# Patient Record
Sex: Female | Born: 1955 | Race: Black or African American | Hispanic: No | Marital: Married | Smoking: Never smoker
Health system: Southern US, Community
[De-identification: ages and names within clinical notes are randomized; demographics above are authoritative.]

## PROBLEM LIST (undated history)

## (undated) DIAGNOSIS — I10 Essential (primary) hypertension: Secondary | ICD-10-CM

## (undated) HISTORY — PX: FOOT SURGERY: SHX648

---

## 2007-08-24 ENCOUNTER — Encounter: Admission: RE | Admit: 2007-08-24 | Discharge: 2007-08-24 | Payer: Self-pay | Admitting: Obstetrics and Gynecology

## 2007-08-28 ENCOUNTER — Encounter: Admission: RE | Admit: 2007-08-28 | Discharge: 2007-08-28 | Payer: Self-pay | Admitting: Interventional Radiology

## 2010-02-20 ENCOUNTER — Encounter: Admission: RE | Admit: 2010-02-20 | Discharge: 2010-02-20 | Payer: Self-pay | Admitting: Obstetrics and Gynecology

## 2010-08-23 ENCOUNTER — Other Ambulatory Visit: Payer: Self-pay | Admitting: Obstetrics and Gynecology

## 2010-08-23 DIAGNOSIS — Z1231 Encounter for screening mammogram for malignant neoplasm of breast: Secondary | ICD-10-CM

## 2010-09-23 ENCOUNTER — Ambulatory Visit
Admission: RE | Admit: 2010-09-23 | Discharge: 2010-09-23 | Disposition: A | Discharge status: Home / Self Care | Payer: BC Managed Care – PPO | Source: Ambulatory Visit | Attending: Obstetrics and Gynecology | Admitting: Obstetrics and Gynecology

## 2010-09-23 DIAGNOSIS — Z1231 Encounter for screening mammogram for malignant neoplasm of breast: Secondary | ICD-10-CM

## 2011-09-03 ENCOUNTER — Other Ambulatory Visit: Payer: Self-pay | Admitting: Obstetrics and Gynecology

## 2011-09-03 DIAGNOSIS — Z1231 Encounter for screening mammogram for malignant neoplasm of breast: Secondary | ICD-10-CM

## 2011-09-24 ENCOUNTER — Ambulatory Visit
Admission: RE | Admit: 2011-09-24 | Discharge: 2011-09-24 | Disposition: A | Discharge status: Home / Self Care | Payer: BC Managed Care – PPO | Source: Ambulatory Visit | Attending: Obstetrics and Gynecology | Admitting: Obstetrics and Gynecology

## 2011-09-24 DIAGNOSIS — Z1231 Encounter for screening mammogram for malignant neoplasm of breast: Secondary | ICD-10-CM

## 2011-10-09 IMAGING — MG MM DIGITAL DIAGNOSTIC UNILAT L
4 series · 4 of 4 positions shown · non-contrast
Comparison: 09/24/2009, 09/20/2009, 09/19/2008, and 09/16/2007
mammograms from [HOSPITAL], [facility].

CLINICAL DATA: The patient was referred for reevaluation of
possible area of asymmetry within the left breast.  The patient has
a family history breast cancer in a maternal aunt (post
menopausal).  There is no other family history breast cancer.

DIGITAL DIAGNOSTIC LEFT BREAST MAMMOGRAM  AND LEFT BREAST
ULTRASOUND:

[L CC (1 of 2)]
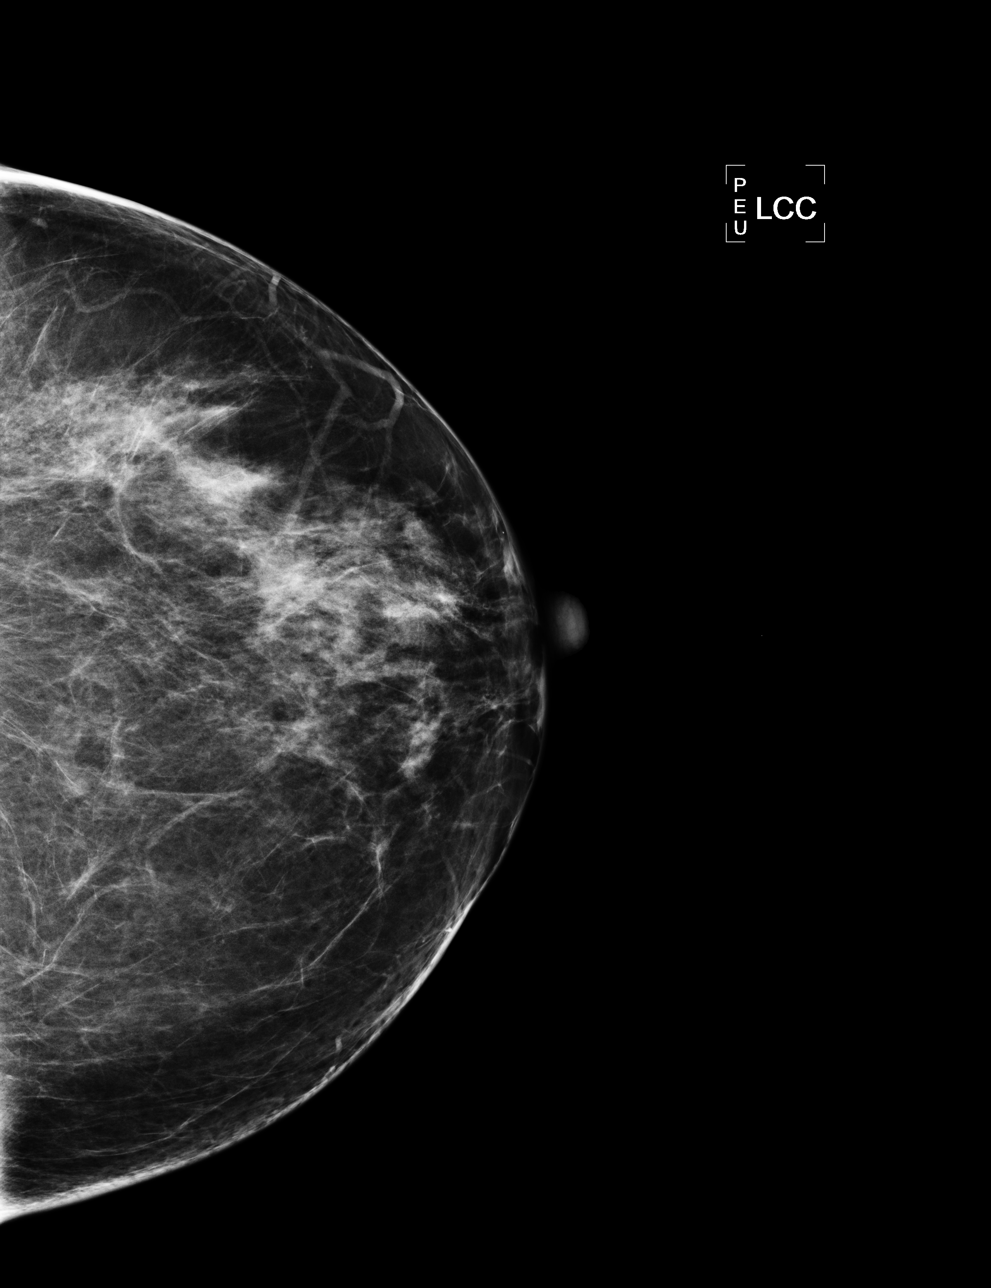

[L MLO (1 of 2)]
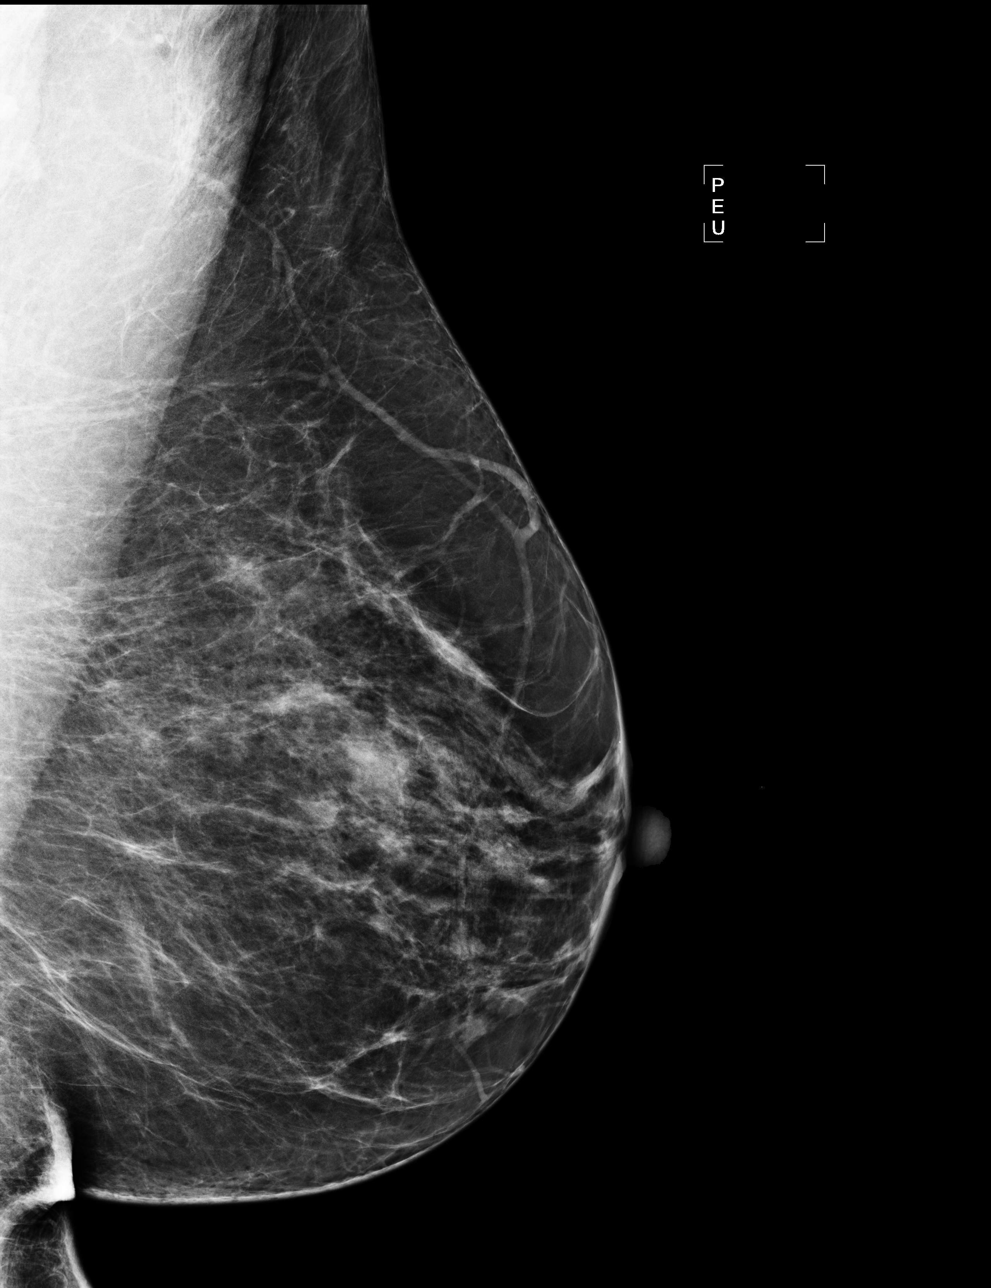

[L CC (2 of 2)]
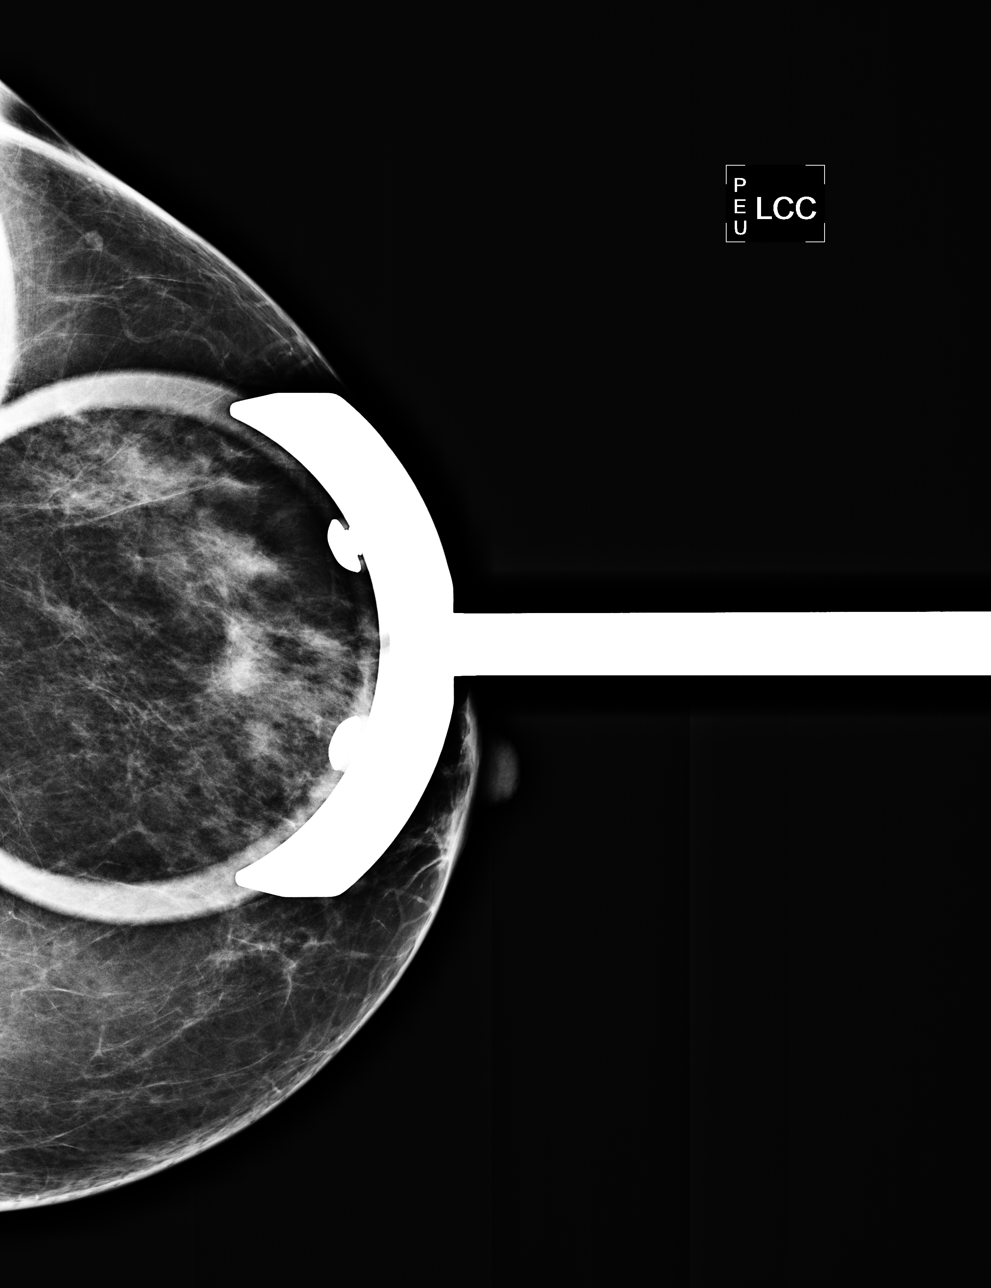

[L MLO (2 of 2)]
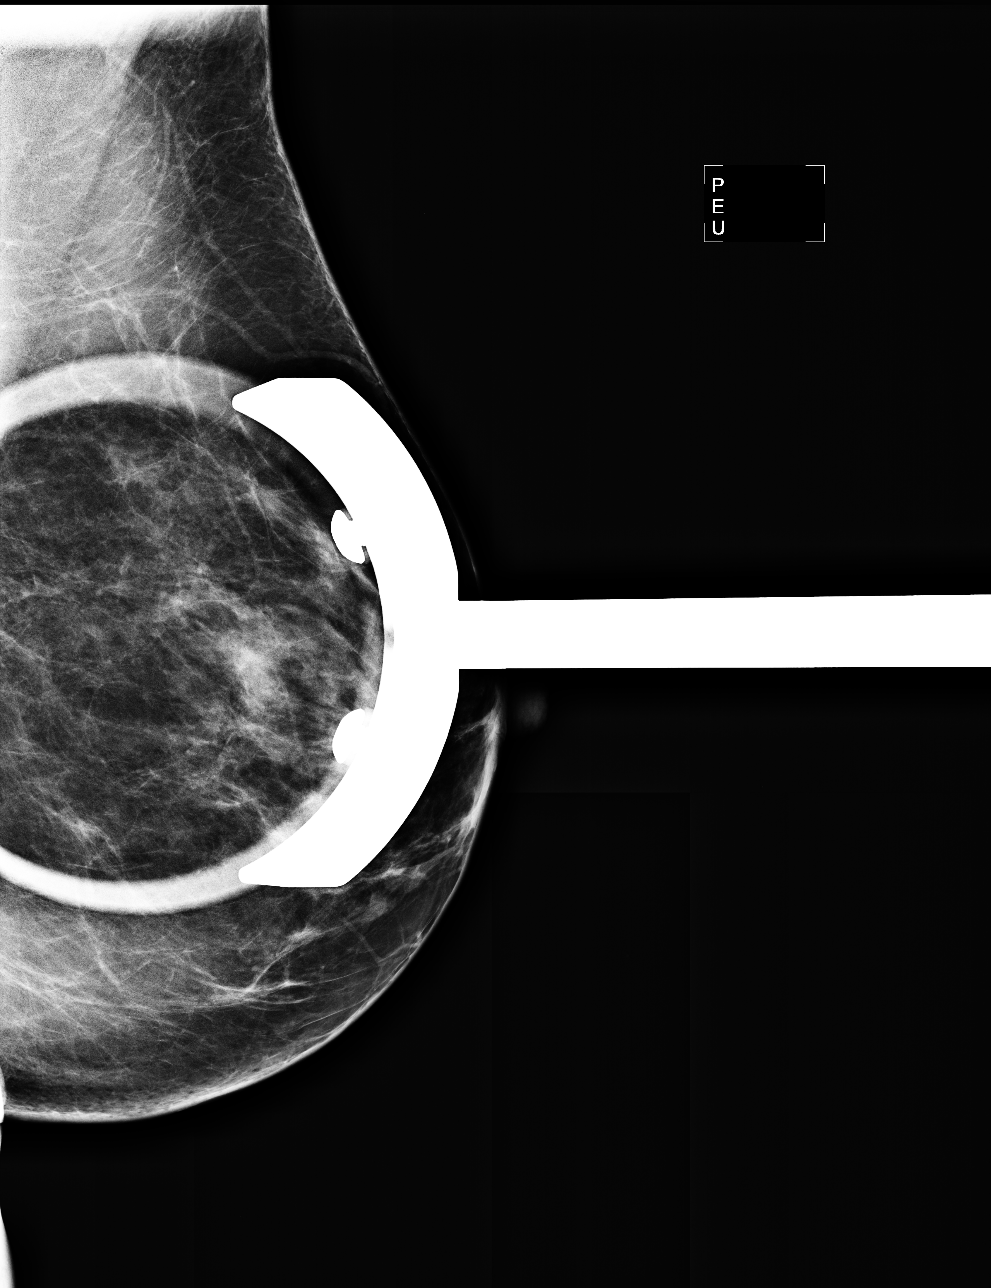

[4 of 4 positions shown; findings below may reference images not displayed]

FINDINGS: There is a scattered fibroglandular parenchymal pattern
present.  When comparing our current study with studies dating back
to 09/16/2007, the area of asymmetry located within the lateral and
subareolar portions of the left breast appears stable and most
consistent with asymmetrical fibroglandular tissue.  There is no
worrisome calcification or distortion.

On physical exam, there is no discrete palpable abnormality within
the subareolar portion of the left breast or upper-outer quadrant
of the left breast.

Ultrasound is performed, showing normal-appearing fibroglandular
tissue within the subareolar portion left breast and also within
the superior and upper-outer portions of the left breast

There is no mass, cyst, distortion, or worrisome shadowing.
IMPRESSION: The area of asymmetry within the left breast is most consistent
with normal asymmetrical fibroglandular tissue.  There are no
findings worrisome for malignancy.  Recommend return to screening
mammography in August 2010. The importance of breast self-
examination was reviewed with the patient.

BI-RADS CATEGORY 1:  Negative.

## 2011-12-01 ENCOUNTER — Other Ambulatory Visit: Payer: Self-pay | Admitting: Obstetrics and Gynecology

## 2011-12-01 DIAGNOSIS — E2839 Other primary ovarian failure: Secondary | ICD-10-CM

## 2011-12-01 DIAGNOSIS — N951 Menopausal and female climacteric states: Secondary | ICD-10-CM

## 2011-12-11 ENCOUNTER — Other Ambulatory Visit: Payer: BC Managed Care – PPO

## 2012-09-29 ENCOUNTER — Other Ambulatory Visit: Payer: Self-pay

## 2012-09-29 DIAGNOSIS — Z1231 Encounter for screening mammogram for malignant neoplasm of breast: Secondary | ICD-10-CM

## 2012-11-04 ENCOUNTER — Ambulatory Visit
Admission: RE | Admit: 2012-11-04 | Discharge: 2012-11-04 | Disposition: A | Discharge status: Home / Self Care | Payer: Managed Care, Other (non HMO) | Source: Ambulatory Visit | Attending: Obstetrics and Gynecology | Admitting: Obstetrics and Gynecology

## 2012-11-04 ENCOUNTER — Ambulatory Visit
Admission: RE | Admit: 2012-11-04 | Discharge: 2012-11-04 | Disposition: A | Discharge status: Home / Self Care | Payer: Managed Care, Other (non HMO) | Source: Ambulatory Visit

## 2012-11-04 DIAGNOSIS — E2839 Other primary ovarian failure: Secondary | ICD-10-CM

## 2012-11-04 DIAGNOSIS — Z1231 Encounter for screening mammogram for malignant neoplasm of breast: Secondary | ICD-10-CM

## 2012-11-04 DIAGNOSIS — N951 Menopausal and female climacteric states: Secondary | ICD-10-CM

## 2013-05-14 ENCOUNTER — Emergency Department (HOSPITAL_COMMUNITY): Payer: Managed Care, Other (non HMO)

## 2013-05-14 ENCOUNTER — Encounter (HOSPITAL_COMMUNITY): Payer: Self-pay | Admitting: Emergency Medicine

## 2013-05-14 ENCOUNTER — Emergency Department (HOSPITAL_COMMUNITY)
Admission: EM | Admit: 2013-05-14 | Discharge: 2013-05-14 | Disposition: A | Discharge status: Home / Self Care | Payer: Managed Care, Other (non HMO) | Attending: Emergency Medicine | Admitting: Emergency Medicine

## 2013-05-14 DIAGNOSIS — R1031 Right lower quadrant pain: Secondary | ICD-10-CM | POA: Insufficient documentation

## 2013-05-14 DIAGNOSIS — G8929 Other chronic pain: Secondary | ICD-10-CM | POA: Insufficient documentation

## 2013-05-14 DIAGNOSIS — R1032 Left lower quadrant pain: Secondary | ICD-10-CM | POA: Insufficient documentation

## 2013-05-14 DIAGNOSIS — K5900 Constipation, unspecified: Secondary | ICD-10-CM | POA: Insufficient documentation

## 2013-05-14 DIAGNOSIS — R109 Unspecified abdominal pain: Secondary | ICD-10-CM

## 2013-05-14 HISTORY — DX: Essential (primary) hypertension: I10

## 2013-05-14 LAB — COMPREHENSIVE METABOLIC PANEL
Albumin: 4.1 g/dL (ref 3.5–5.2)
BUN: 9 mg/dL (ref 6–23)
Calcium: 9.5 mg/dL (ref 8.4–10.5)
Chloride: 103 mEq/L (ref 96–112)
Creatinine, Ser: 0.77 mg/dL (ref 0.50–1.10)
Total Bilirubin: 0.3 mg/dL (ref 0.3–1.2)
Total Protein: 7.5 g/dL (ref 6.0–8.3)

## 2013-05-14 LAB — CBC WITH DIFFERENTIAL/PLATELET
Basophils Absolute: 0 10*3/uL (ref 0.0–0.1)
Basophils Relative: 0 % (ref 0–1)
Eosinophils Absolute: 0.1 10*3/uL (ref 0.0–0.7)
Eosinophils Relative: 2 % (ref 0–5)
HCT: 40.4 % (ref 36.0–46.0)
Hemoglobin: 13.7 g/dL (ref 12.0–15.0)
MCH: 31.5 pg (ref 26.0–34.0)
MCHC: 33.9 g/dL (ref 30.0–36.0)
MCV: 92.9 fL (ref 78.0–100.0)
Monocytes Absolute: 0.3 10*3/uL (ref 0.1–1.0)
Monocytes Relative: 7 % (ref 3–12)
RDW: 15.1 % (ref 11.5–15.5)

## 2013-05-14 LAB — LIPASE, BLOOD: Lipase: 32 U/L (ref 11–59)

## 2013-05-14 LAB — URINALYSIS, ROUTINE W REFLEX MICROSCOPIC
Bilirubin Urine: NEGATIVE
Glucose, UA: NEGATIVE mg/dL
Ketones, ur: NEGATIVE mg/dL
Protein, ur: NEGATIVE mg/dL
pH: 7 (ref 5.0–8.0)

## 2013-05-14 MED ORDER — HYDROCODONE-ACETAMINOPHEN 5-325 MG PO TABS: ORAL_TABLET | ORAL | Status: DC

## 2013-05-14 MED ORDER — IOHEXOL 300 MG/ML  SOLN
50.0000 mL | Freq: Once | INTRAMUSCULAR | Status: AC | PRN
  Administered 2013-05-14: 50 mL via ORAL

## 2013-05-14 MED ORDER — SODIUM CHLORIDE 0.9 % IV BOLUS (SEPSIS)
500.0000 mL | Freq: Once | INTRAVENOUS | Status: AC
  Administered 2013-05-14: 500 mL via INTRAVENOUS

## 2013-05-14 MED ORDER — ONDANSETRON HCL 4 MG/2ML IJ SOLN: 4.0000 mg | Freq: Once | INTRAMUSCULAR | Status: DC

## 2013-05-14 MED ORDER — MORPHINE SULFATE 4 MG/ML IJ SOLN: 4.0000 mg | Freq: Once | INTRAMUSCULAR | Status: DC

## 2013-05-14 NOTE — ED Notes (Signed)
Pt denies nausea and did not want pain /nausea meds at this time due to low pain rate. Nad.

## 2013-05-14 NOTE — ED Notes (Signed)
Pt decided not to do ct scan and advised pa. Advised pt not to drink anymore contrast.

## 2013-05-14 NOTE — ED Provider Notes (Signed)
CSN: [id number]     Arrival date & time 05/14/13  0865 History   First MD Initiated Contact with Patient 05/14/13 [phone]     Chief Complaint  Patient presents with  . Abdominal Pain   (Consider location/radiation/quality/duration/timing/severity/associated sxs/prior Treatment) Patient is a 57 y.o. female presenting with abdominal pain. The history is provided by the patient.  Abdominal Pain Pain location:  RLQ Pain quality: sharp   Pain radiates to:  LLQ Pain severity:  Mild Onset quality:  Gradual Duration: 3 months. Timing:  Intermittent Progression:  Waxing and waning Chronicity:  Chronic Context comment:  Pain to her right lower stomach became worse last evening after eating  fried seafood Relieved by:  Lying down Exacerbated by: Certain foods and sitting. Ineffective treatments:  None tried Associated symptoms: constipation   Associated symptoms: no belching, no chest pain, no chills, no cough, no diarrhea, no dysuria, no fever, no flatus, no hematemesis, no hematochezia, no hematuria, no melena, no nausea, no shortness of breath, no vaginal bleeding, no vaginal discharge and no vomiting    Patient reports waxing and waning of her symptoms for 3 months. Pain has been worse since last evening. She states that she comes to the emergency department this morning because her primary care physician in [place] has evaluated her for the same as well as the emergency department at [facility], a urologist and her gynecologist and her tests have been inconclusive. She also states that her primary care physician is supposed to refer her to a gastroenterologist but she has been waiting for the appointment for over one month and has not heard back from anyone. She denies any vomiting diarrhea or bloody stools. She also denies any fever , dysuria vaginal bleeding, chest or back pain.    No past medical history on file. No past surgical history on file. No family history on file. History    Substance Use Topics  . Smoking status: Not on file  . Smokeless tobacco: Not on file  . Alcohol Use: Not on file   OB History   Grav Para Term Preterm Abortions TAB SAB Ect Mult Living                 Review of Systems  Constitutional: Negative for fever, chills, activity change and appetite change.  Respiratory: Negative for cough and shortness of breath.   Cardiovascular: Negative for chest pain.  Gastrointestinal: Positive for abdominal pain and constipation. Negative for nausea, vomiting, diarrhea, blood in stool, melena, hematochezia, abdominal distention, flatus and hematemesis.  Genitourinary: Negative for dysuria, hematuria, flank pain, decreased urine volume, vaginal bleeding, vaginal discharge, difficulty urinating and vaginal pain.  Musculoskeletal: Negative for back pain.  Skin: Negative for color change and rash.  Neurological: Negative for dizziness, syncope, weakness and numbness.  Hematological: Negative for adenopathy.  All other systems reviewed and are negative.    Allergies  Review of patient's allergies indicates no known allergies.  Home Medications  No current outpatient prescriptions on file. There were no vitals taken for this visit. Physical Exam  Nursing note and vitals reviewed. Constitutional: She is oriented to person, place, and time. She appears well-developed and well-nourished. No distress.  HENT:  Head: Normocephalic and atraumatic.  Mouth/Throat: Oropharynx is clear and moist.  Cardiovascular: Normal rate, regular rhythm, normal heart sounds and intact distal pulses.   No murmur heard. Pulmonary/Chest: Effort normal and breath sounds normal. No respiratory distress.  Abdominal: Soft. Normal appearance and bowel sounds are normal. She exhibits  no distension and no mass. There is no hepatosplenomegaly. There is tenderness in the right lower quadrant. There is no rigidity, no rebound, no guarding, no CVA tenderness and no tenderness at  McBurney's point.    Mild ttp of the RLQ, no guarding or rebound tenderness.  No CVA tenderness  Musculoskeletal: Normal range of motion. She exhibits no edema.  Neurological: She is alert and oriented to person, place, and time. She exhibits normal muscle tone. Coordination normal.  Skin: Skin is warm and dry.    ED Course  Procedures (including critical care time) Labs Review Labs Reviewed  URINALYSIS, ROUTINE W REFLEX MICROSCOPIC - Abnormal; Notable for the following:    Specific Gravity, Urine <1.005 (*)    All other components within normal limits  CBC WITH DIFFERENTIAL  COMPREHENSIVE METABOLIC PANEL  LIPASE, BLOOD   Imaging Review No results found.  EKG Interpretation   None       MDM   Patient with hx of recurrent, chronic pain to her RLQ that has been evaluated by her urologist, gyn and PMD for same.  No concerning sx's for acute abdomen at this time.  VSS.  CT abd/pelvis was ordered but pt refused.  Agrees to labs and referral to GI.   Labs were reviewed and discussed with the patient. She agrees to GI referral. She appears stable for discharge at this time.  Shekita Boyden L. Rahmel Nedved, PA-C 05/14/13 1019

## 2013-05-14 NOTE — ED Notes (Signed)
Pt. Reports abdominal pain starting in June that has persisted. Pt. Denies pain but reports discomfort in right abdomen radiating to back that kept her awake last night. Pt. States that she has been to primary care doctor who recommended her she visit a GI Dr., pt has not been able to make an appointment. Pt. Denies N/V/D.

## 2013-05-15 NOTE — ED Provider Notes (Signed)
Medical screening examination/treatment/procedure(s) were performed by non-physician practitioner and as supervising physician I was immediately available for consultation/collaboration.  EKG Interpretation   None         Marysol Wellnitz J Kineta Fudala, MD 05/15/13 0650 

## 2013-11-10 ENCOUNTER — Other Ambulatory Visit: Payer: Self-pay

## 2013-11-10 DIAGNOSIS — Z1231 Encounter for screening mammogram for malignant neoplasm of breast: Secondary | ICD-10-CM

## 2013-11-11 ENCOUNTER — Ambulatory Visit
Admission: RE | Admit: 2013-11-11 | Discharge: 2013-11-11 | Disposition: A | Discharge status: Home / Self Care | Payer: Managed Care, Other (non HMO) | Source: Ambulatory Visit

## 2013-11-11 DIAGNOSIS — Z1231 Encounter for screening mammogram for malignant neoplasm of breast: Secondary | ICD-10-CM

## 2014-10-25 ENCOUNTER — Other Ambulatory Visit: Payer: Self-pay

## 2014-10-25 DIAGNOSIS — Z1231 Encounter for screening mammogram for malignant neoplasm of breast: Secondary | ICD-10-CM

## 2014-11-23 ENCOUNTER — Ambulatory Visit: Payer: Managed Care, Other (non HMO)

## 2014-12-01 ENCOUNTER — Ambulatory Visit: Payer: Managed Care, Other (non HMO)

## 2014-12-21 ENCOUNTER — Ambulatory Visit
Admission: RE | Admit: 2014-12-21 | Discharge: 2014-12-21 | Disposition: A | Discharge status: Home / Self Care | Payer: Managed Care, Other (non HMO) | Source: Ambulatory Visit

## 2014-12-21 DIAGNOSIS — Z1231 Encounter for screening mammogram for malignant neoplasm of breast: Secondary | ICD-10-CM

## 2015-08-20 ENCOUNTER — Other Ambulatory Visit: Payer: Self-pay

## 2015-08-20 DIAGNOSIS — Z1231 Encounter for screening mammogram for malignant neoplasm of breast: Secondary | ICD-10-CM

## 2015-08-29 ENCOUNTER — Other Ambulatory Visit: Payer: Self-pay | Admitting: Obstetrics and Gynecology

## 2015-08-29 DIAGNOSIS — N644 Mastodynia: Secondary | ICD-10-CM

## 2015-09-14 ENCOUNTER — Ambulatory Visit
Admission: RE | Admit: 2015-09-14 | Discharge: 2015-09-14 | Disposition: A | Discharge status: Home / Self Care | Payer: Managed Care, Other (non HMO) | Source: Ambulatory Visit | Attending: Obstetrics and Gynecology | Admitting: Obstetrics and Gynecology

## 2015-09-14 DIAGNOSIS — N644 Mastodynia: Secondary | ICD-10-CM

## 2015-12-26 ENCOUNTER — Other Ambulatory Visit: Payer: Self-pay | Admitting: Obstetrics and Gynecology

## 2015-12-26 DIAGNOSIS — Z1231 Encounter for screening mammogram for malignant neoplasm of breast: Secondary | ICD-10-CM

## 2016-01-10 ENCOUNTER — Ambulatory Visit
Admission: RE | Admit: 2016-01-10 | Discharge: 2016-01-10 | Disposition: A | Discharge status: Home / Self Care | Payer: Managed Care, Other (non HMO) | Source: Ambulatory Visit | Attending: Obstetrics and Gynecology | Admitting: Obstetrics and Gynecology

## 2016-01-10 ENCOUNTER — Ambulatory Visit: Payer: Managed Care, Other (non HMO)

## 2016-01-10 DIAGNOSIS — Z1231 Encounter for screening mammogram for malignant neoplasm of breast: Secondary | ICD-10-CM

## 2016-10-27 ENCOUNTER — Other Ambulatory Visit: Payer: Self-pay | Admitting: Gastroenterology

## 2016-11-04 ENCOUNTER — Other Ambulatory Visit: Payer: Self-pay | Admitting: Obstetrics and Gynecology

## 2016-11-04 DIAGNOSIS — Z1231 Encounter for screening mammogram for malignant neoplasm of breast: Secondary | ICD-10-CM

## 2016-11-07 ENCOUNTER — Ambulatory Visit (HOSPITAL_COMMUNITY)
Admission: RE | Admit: 2016-11-07 | Discharge: 2016-11-07 | Disposition: A | Discharge status: Home / Self Care | Payer: Managed Care, Other (non HMO) | Source: Ambulatory Visit | Attending: Gastroenterology | Admitting: Gastroenterology

## 2016-11-07 ENCOUNTER — Encounter (HOSPITAL_COMMUNITY): Payer: Self-pay

## 2016-11-07 ENCOUNTER — Ambulatory Visit (HOSPITAL_COMMUNITY): Payer: Managed Care, Other (non HMO) | Admitting: Anesthesiology

## 2016-11-07 ENCOUNTER — Encounter (HOSPITAL_COMMUNITY)
Admission: RE | Disposition: A | Discharge status: Home / Self Care | Payer: Self-pay | Source: Ambulatory Visit | Attending: Gastroenterology

## 2016-11-07 DIAGNOSIS — R1011 Right upper quadrant pain: Secondary | ICD-10-CM | POA: Diagnosis present

## 2016-11-07 DIAGNOSIS — I10 Essential (primary) hypertension: Secondary | ICD-10-CM | POA: Insufficient documentation

## 2016-11-07 DIAGNOSIS — Z79899 Other long term (current) drug therapy: Secondary | ICD-10-CM | POA: Diagnosis not present

## 2016-11-07 DIAGNOSIS — Z7982 Long term (current) use of aspirin: Secondary | ICD-10-CM | POA: Insufficient documentation

## 2016-11-07 DIAGNOSIS — R748 Abnormal levels of other serum enzymes: Secondary | ICD-10-CM | POA: Diagnosis not present

## 2016-11-07 HISTORY — PX: UPPER ESOPHAGEAL ENDOSCOPIC ULTRASOUND (EUS): SHX6562

## 2016-11-07 SURGERY — UPPER ESOPHAGEAL ENDOSCOPIC ULTRASOUND (EUS)
Anesthesia: Monitor Anesthesia Care

## 2016-11-07 MED ORDER — PROPOFOL 500 MG/50ML IV EMUL
INTRAVENOUS | Status: DC | PRN
  Administered 2016-11-07: 125 ug/kg/min via INTRAVENOUS

## 2016-11-07 MED ORDER — SODIUM CHLORIDE 0.9 % IV SOLN: INTRAVENOUS | Status: DC

## 2016-11-07 MED ORDER — PROPOFOL 10 MG/ML IV BOLUS
INTRAVENOUS | Status: AC
Start: 2016-11-07 — End: 2016-11-07
  Filled 2016-11-07: qty 40

## 2016-11-07 MED ORDER — LACTATED RINGERS IV SOLN
INTRAVENOUS | Status: DC
Start: 2016-11-07 — End: 2016-11-07
  Administered 2016-11-07: 1000 mL via INTRAVENOUS

## 2016-11-07 MED ORDER — PROPOFOL 10 MG/ML IV BOLUS
INTRAVENOUS | Status: AC
  Filled 2016-11-07: qty 20

## 2016-11-07 MED ORDER — PROPOFOL 10 MG/ML IV BOLUS
INTRAVENOUS | Status: DC | PRN
  Administered 2016-11-07: 40 mg via INTRAVENOUS
  Administered 2016-11-07: 10 mg via INTRAVENOUS
  Administered 2016-11-07 (×2): 20 mg via INTRAVENOUS

## 2016-11-07 NOTE — H&P (Signed)
Katelyn Hudson HPI: The patient was started on Linzess 145 mcg and also given a trial of Dexilant for her RUQ pain. Her ultrasound 04/2016 was negative for any evidence of stones or dilated ducts. She only took one dose of Linzess and then she suffered with diarrhea for 3 days. As for the Dexilant she did not use it and she tried Nexium 20 mg. She still suffers with bloating, early satiety, and RUQ discomfort   Past Medical History:  Diagnosis Date  . Hypertension     Past Surgical History:  Procedure Laterality Date  . FOOT SURGERY      History reviewed. No pertinent family history.  Social History:  reports that she has never smoked. She has never used smokeless tobacco. She reports that she does not drink alcohol or use drugs.  Allergies: No Known Allergies  Medications:  Scheduled:  Continuous: . sodium chloride    . lactated ringers 1,000 mL (11/07/16 1108)    No results found for this or any previous visit (from the past 24 hour(s)).   No results found.  ROS:  As stated above in the HPI otherwise negative.  Blood pressure (!) 148/81, pulse (!) 55, temperature 98.2 F (36.8 C), temperature source Oral, resp. rate 14, height 4\' 11"  (1.499 m), weight 59 kg (130 lb), SpO2 100 %.    PE: Gen: NAD, Alert and Oriented HEENT:  [place]/AT, EOMI Neck: Supple, no LAD Lungs: CTA Bilaterally CV: RRR without M/G/R ABM: Soft, NTND, +BS Ext: No C/C/E  Assessment/Plan: 1) RUQ pain with intermittent abnormal liver enzymes - EUS to check for sludge/stones.  Melik Blancett D 11/07/2016, 11:31 AM

## 2016-11-07 NOTE — Anesthesia Postprocedure Evaluation (Signed)
Anesthesia Post Note  Patient: Katelyn Hudson  Procedure(s) Performed: Procedure(s) (LRB): UPPER ESOPHAGEAL ENDOSCOPIC ULTRASOUND (EUS) (N/A)     Patient location during evaluation: PACU Anesthesia Type: MAC Level of consciousness: awake and alert Pain management: pain level controlled Vital Signs Assessment: post-procedure vital signs reviewed and stable Respiratory status: spontaneous breathing, nonlabored ventilation and respiratory function stable Cardiovascular status: stable and blood pressure returned to baseline Anesthetic complications: no    Last Vitals:  Vitals:   11/07/16 1235 11/07/16 1240  BP:  (!) 157/83  Pulse: (!) 54 (!) 53  Resp: 16 18  Temp:      Last Pain:  Vitals:   11/07/16 1220  TempSrc: Oral                 Lynda Rainwater

## 2016-11-07 NOTE — Discharge Instructions (Signed)

## 2016-11-07 NOTE — Transfer of Care (Signed)
Immediate Anesthesia Transfer of Care Note  Patient: Katelyn Hudson  Procedure(s) Performed: Procedure(s): UPPER ESOPHAGEAL ENDOSCOPIC ULTRASOUND (EUS) (N/A)  Patient Location: PACU  Anesthesia Type:MAC  Level of Consciousness: awake, alert  and oriented  Airway & Oxygen Therapy: Patient Spontanous Breathing and Patient connected to nasal cannula oxygen  Post-op Assessment: Report given to RN and Post -op Vital signs reviewed and stable  Post vital signs: Reviewed and stable  Last Vitals:  Vitals:   11/07/16 1056  BP: (!) 148/81  Pulse: (!) 55  Resp: 14  Temp: 36.8 C    Last Pain:  Vitals:   11/07/16 1056  TempSrc: Oral         Complications: No apparent anesthesia complications

## 2016-11-07 NOTE — Op Note (Signed)
[facility] Patient Name: Katelyn Hudson Procedure Date: 11/07/2016 MRN: [id number] Attending MD: Jeani Hawking , MD Date of Birth: 1955/07/23 CSN: [id number] Age: 61 Admit Type: Outpatient Procedure:                Upper EUS Indications:              Elevated liver enzymes, Suspected                            choledocholithiasis, Abdominal pain in the right                            upper quadrant Providers:                Jeani Hawking, MD, Dwain Sarna, RN, Beryle Beams,                            Technician, Roni Bread, CRNA Referring MD:              Medicines:                Propofol per Anesthesia Complications:            No immediate complications. Estimated Blood Loss:     Estimated blood loss: none. Procedure:                Pre-Anesthesia Assessment:                           - Prior to the procedure, a History and Physical                            was performed, and patient medications and                            allergies were reviewed. The patient's tolerance of                            previous anesthesia was also reviewed. The risks                            and benefits of the procedure and the sedation                            options and risks were discussed with the patient.                            All questions were answered, and informed consent                            was obtained. Prior Anticoagulants: The patient has                            taken no previous anticoagulant or antiplatelet                            agents. ASA Grade Assessment: II -  A patient with                            mild systemic disease. After reviewing the risks                            and benefits, the patient was deemed in                            satisfactory condition to undergo the procedure.                           - Sedation was administered by an anesthesia                            professional. Deep sedation was  attained.                           After obtaining informed consent, the endoscope was                            passed under direct vision. Throughout the                            procedure, the patient's blood pressure, pulse, and                            oxygen saturations were monitored continuously. The                            [id number] ([id number]) scope was introduced through                            the mouth, and advanced to the second part of                            duodenum. The upper EUS was accomplished without                            difficulty. The patient tolerated the procedure                            well. Scope In: Scope Out: Findings:      Endosonographic Finding :      There was no sign of significant endosonographic abnormality in the       ampulla. No masses, no cysts and no Hudson thickening were identified.      There was no sign of significant endosonographic abnormality in the       common bile duct and in the gallbladder. The maximum diameter of the       ducts were 2 mm. An unremarkable gallbladder, no stones, no biliary       sludge and ducts of normal caliber were identified.      There was no sign of significant endosonographic abnormality in the       pancreatic head, in  the genu of the pancreas, in the pancreatic body, in       the pancreatic tail, in the uncinate process of the pancreas, in the       pancreatic neck and in the main pancreatic duct. The pancreatic duct       measured up to 2 mm in diameter. No masses, no cysts, no calcifications,       the pancreatic duct was well visualized from ampulla to tail, the       pancreatic duct was thin in caliber, the pancreatic duct was regular in       contour. Impression:               - There was no sign of significant pathology in the                            ampulla.                           - There was no sign of significant pathology in the                            common bile  duct and in the gallbladder.                           - There was no sign of significant pathology in the                            pancreatic head, in the genu of the pancreas, in                            the pancreatic body, in the pancreatic tail, in the                            uncinate process of the pancreas, in the pancreatic                            neck and in the main pancreatic duct.                           - No specimens collected. Moderate Sedation:      N/A- Per Anesthesia Care Recommendation:           - Patient has a contact number available for                            emergencies. The signs and symptoms of potential                            delayed complications were discussed with the                            patient. Return to normal activities tomorrow.                            Written discharge instructions were provided to  the                            patient.                           - Resume regular diet.                           - I will discuss with the patient about possibly                            treating with Buspar and/or imipramine. Procedure Code(s):        --- Professional ---                           [phone], Esophagogastroduodenoscopy, flexible,                            transoral; with endoscopic ultrasound examination                            limited to the esophagus, stomach or duodenum, and                            adjacent structures Diagnosis Code(s):        --- Professional ---                           R10.11, Right upper quadrant pain                           R74.8, Abnormal levels of other serum enzymes CPT copyright 2016 [employer]. All rights reserved. The codes documented in this report are preliminary and upon coder review may  be revised to meet current compliance requirements. Jeani Hawking, MD Jeani Hawking, MD 11/07/2016 12:22:58 PM This report has been signed electronically. Number of  Addenda: 0

## 2016-11-07 NOTE — Anesthesia Preprocedure Evaluation (Addendum)
Anesthesia Evaluation  Patient identified by MRN, date of birth, ID band Patient awake    Reviewed: Allergy & Precautions, NPO status , Patient's Chart, lab work & pertinent test results  Airway Mallampati: II  TM Distance: >3 FB Neck ROM: Full    Dental no notable dental hx.    Pulmonary neg pulmonary ROS,    Pulmonary exam normal breath sounds clear to auscultation       Cardiovascular hypertension, Pt. on medications negative cardio ROS Normal cardiovascular exam Rhythm:Regular Rate:Normal     Neuro/Psych negative neurological ROS  negative psych ROS   GI/Hepatic negative GI ROS, Neg liver ROS,   Endo/Other  negative endocrine ROS  Renal/GU negative Renal ROS  negative genitourinary   Musculoskeletal negative musculoskeletal ROS (+)   Abdominal   Peds negative pediatric ROS (+)  Hematology negative hematology ROS (+)   Anesthesia Other Findings   Reproductive/Obstetrics negative OB ROS                             Anesthesia Physical Anesthesia Plan  ASA: II  Anesthesia Plan: MAC   Post-op Pain Management:    Induction: Intravenous  PONV Risk Score and Plan: 3 and Ondansetron, Propofol and Midazolam  Airway Management Planned:   Additional Equipment:   Intra-op Plan:   Post-operative Plan:   Informed Consent: I have reviewed the patients History and Physical, chart, labs and discussed the procedure including the risks, benefits and alternatives for the proposed anesthesia with the patient or authorized representative who has indicated his/her understanding and acceptance.   Dental advisory given  Plan Discussed with: CRNA  Anesthesia Plan Comments:         Anesthesia Quick Evaluation

## 2016-11-10 ENCOUNTER — Encounter (HOSPITAL_COMMUNITY): Payer: Self-pay | Admitting: Gastroenterology

## 2017-01-12 ENCOUNTER — Ambulatory Visit: Payer: Managed Care, Other (non HMO)

## 2017-01-15 ENCOUNTER — Ambulatory Visit: Payer: Managed Care, Other (non HMO)

## 2017-02-04 ENCOUNTER — Ambulatory Visit
Admission: RE | Admit: 2017-02-04 | Discharge: 2017-02-04 | Disposition: A | Discharge status: Home / Self Care | Payer: BLUE CROSS/BLUE SHIELD | Source: Ambulatory Visit | Attending: Obstetrics and Gynecology | Admitting: Obstetrics and Gynecology

## 2017-02-04 DIAGNOSIS — Z1231 Encounter for screening mammogram for malignant neoplasm of breast: Secondary | ICD-10-CM

## 2017-12-28 ENCOUNTER — Other Ambulatory Visit: Payer: Self-pay | Admitting: Obstetrics and Gynecology

## 2018-01-01 ENCOUNTER — Other Ambulatory Visit: Payer: Self-pay | Admitting: Nurse Practitioner

## 2018-01-01 DIAGNOSIS — N6320 Unspecified lump in the left breast, unspecified quadrant: Secondary | ICD-10-CM

## 2018-01-14 ENCOUNTER — Ambulatory Visit: Payer: BLUE CROSS/BLUE SHIELD

## 2018-01-14 ENCOUNTER — Ambulatory Visit
Admission: RE | Admit: 2018-01-14 | Discharge: 2018-01-14 | Disposition: A | Discharge status: Home / Self Care | Payer: BLUE CROSS/BLUE SHIELD | Source: Ambulatory Visit | Attending: Nurse Practitioner | Admitting: Nurse Practitioner

## 2018-01-14 DIAGNOSIS — N6320 Unspecified lump in the left breast, unspecified quadrant: Secondary | ICD-10-CM

## 2018-02-08 ENCOUNTER — Other Ambulatory Visit: Payer: Self-pay | Admitting: Obstetrics and Gynecology

## 2018-02-08 DIAGNOSIS — Z1231 Encounter for screening mammogram for malignant neoplasm of breast: Secondary | ICD-10-CM

## 2018-03-10 ENCOUNTER — Ambulatory Visit
Admission: RE | Admit: 2018-03-10 | Discharge: 2018-03-10 | Disposition: A | Discharge status: Home / Self Care | Payer: BLUE CROSS/BLUE SHIELD | Source: Ambulatory Visit | Attending: Obstetrics and Gynecology | Admitting: Obstetrics and Gynecology

## 2018-03-10 ENCOUNTER — Ambulatory Visit: Payer: BLUE CROSS/BLUE SHIELD

## 2018-03-10 DIAGNOSIS — Z1231 Encounter for screening mammogram for malignant neoplasm of breast: Secondary | ICD-10-CM

## 2018-03-11 ENCOUNTER — Ambulatory Visit: Payer: BLUE CROSS/BLUE SHIELD

## 2019-01-28 ENCOUNTER — Other Ambulatory Visit: Payer: Self-pay | Admitting: Nurse Practitioner

## 2019-01-28 DIAGNOSIS — Z1231 Encounter for screening mammogram for malignant neoplasm of breast: Secondary | ICD-10-CM

## 2019-03-17 ENCOUNTER — Ambulatory Visit: Payer: BLUE CROSS/BLUE SHIELD

## 2021-12-25 ENCOUNTER — Other Ambulatory Visit: Payer: Self-pay | Admitting: Obstetrics and Gynecology

## 2021-12-25 DIAGNOSIS — Z1231 Encounter for screening mammogram for malignant neoplasm of breast: Secondary | ICD-10-CM

## 2022-01-02 DIAGNOSIS — L820 Inflamed seborrheic keratosis: Secondary | ICD-10-CM | POA: Diagnosis not present

## 2022-01-22 DIAGNOSIS — R195 Other fecal abnormalities: Secondary | ICD-10-CM | POA: Diagnosis not present

## 2022-01-22 DIAGNOSIS — R194 Change in bowel habit: Secondary | ICD-10-CM | POA: Diagnosis not present

## 2022-01-22 DIAGNOSIS — R1084 Generalized abdominal pain: Secondary | ICD-10-CM | POA: Diagnosis not present

## 2022-01-29 DIAGNOSIS — M629 Disorder of muscle, unspecified: Secondary | ICD-10-CM | POA: Diagnosis not present

## 2022-02-05 DIAGNOSIS — A5900 Urogenital trichomoniasis, unspecified: Secondary | ICD-10-CM | POA: Diagnosis not present

## 2022-02-05 DIAGNOSIS — Z01411 Encounter for gynecological examination (general) (routine) with abnormal findings: Secondary | ICD-10-CM | POA: Diagnosis not present

## 2022-02-05 DIAGNOSIS — Z0142 Encounter for cervical smear to confirm findings of recent normal smear following initial abnormal smear: Secondary | ICD-10-CM | POA: Diagnosis not present

## 2022-02-05 DIAGNOSIS — Z01419 Encounter for gynecological examination (general) (routine) without abnormal findings: Secondary | ICD-10-CM | POA: Diagnosis not present

## 2022-02-05 DIAGNOSIS — Z124 Encounter for screening for malignant neoplasm of cervix: Secondary | ICD-10-CM | POA: Diagnosis not present

## 2022-02-05 DIAGNOSIS — Z6827 Body mass index (BMI) 27.0-27.9, adult: Secondary | ICD-10-CM | POA: Diagnosis not present

## 2022-02-19 ENCOUNTER — Ambulatory Visit
Admission: RE | Admit: 2022-02-19 | Discharge: 2022-02-19 | Disposition: A | Discharge status: Home / Self Care | Payer: Medicare PPO | Source: Ambulatory Visit | Attending: Obstetrics and Gynecology | Admitting: Obstetrics and Gynecology

## 2022-02-19 DIAGNOSIS — Z1231 Encounter for screening mammogram for malignant neoplasm of breast: Secondary | ICD-10-CM

## 2022-03-13 DIAGNOSIS — G4733 Obstructive sleep apnea (adult) (pediatric): Secondary | ICD-10-CM | POA: Diagnosis not present

## 2022-03-13 DIAGNOSIS — R001 Bradycardia, unspecified: Secondary | ICD-10-CM | POA: Diagnosis not present

## 2022-03-13 DIAGNOSIS — E7841 Elevated Lipoprotein(a): Secondary | ICD-10-CM | POA: Diagnosis not present

## 2022-03-13 DIAGNOSIS — R002 Palpitations: Secondary | ICD-10-CM | POA: Diagnosis not present

## 2022-03-13 DIAGNOSIS — Z79899 Other long term (current) drug therapy: Secondary | ICD-10-CM | POA: Diagnosis not present

## 2022-03-13 DIAGNOSIS — I10 Essential (primary) hypertension: Secondary | ICD-10-CM | POA: Diagnosis not present

## 2022-03-27 DIAGNOSIS — E7800 Pure hypercholesterolemia, unspecified: Secondary | ICD-10-CM | POA: Diagnosis not present

## 2022-03-27 DIAGNOSIS — D649 Anemia, unspecified: Secondary | ICD-10-CM | POA: Diagnosis not present

## 2022-03-27 DIAGNOSIS — I10 Essential (primary) hypertension: Secondary | ICD-10-CM | POA: Diagnosis not present

## 2022-03-27 DIAGNOSIS — Z23 Encounter for immunization: Secondary | ICD-10-CM | POA: Diagnosis not present

## 2022-03-27 DIAGNOSIS — Z7689 Persons encountering health services in other specified circumstances: Secondary | ICD-10-CM | POA: Diagnosis not present

## 2022-03-27 DIAGNOSIS — G4733 Obstructive sleep apnea (adult) (pediatric): Secondary | ICD-10-CM | POA: Diagnosis not present

## 2022-03-27 DIAGNOSIS — Z0189 Encounter for other specified special examinations: Secondary | ICD-10-CM | POA: Diagnosis not present

## 2022-03-27 DIAGNOSIS — Z79899 Other long term (current) drug therapy: Secondary | ICD-10-CM | POA: Diagnosis not present

## 2022-04-04 ENCOUNTER — Other Ambulatory Visit: Payer: Self-pay | Admitting: Obstetrics and Gynecology

## 2022-04-04 DIAGNOSIS — E2839 Other primary ovarian failure: Secondary | ICD-10-CM

## 2022-04-14 DIAGNOSIS — M79671 Pain in right foot: Secondary | ICD-10-CM | POA: Diagnosis not present

## 2022-04-29 DIAGNOSIS — R399 Unspecified symptoms and signs involving the genitourinary system: Secondary | ICD-10-CM | POA: Diagnosis not present

## 2022-04-29 DIAGNOSIS — N390 Urinary tract infection, site not specified: Secondary | ICD-10-CM | POA: Diagnosis not present

## 2022-05-01 DIAGNOSIS — Z7189 Other specified counseling: Secondary | ICD-10-CM | POA: Diagnosis not present

## 2022-05-01 DIAGNOSIS — Z0000 Encounter for general adult medical examination without abnormal findings: Secondary | ICD-10-CM | POA: Diagnosis not present

## 2022-05-22 DIAGNOSIS — N898 Other specified noninflammatory disorders of vagina: Secondary | ICD-10-CM | POA: Diagnosis not present

## 2022-05-22 DIAGNOSIS — N390 Urinary tract infection, site not specified: Secondary | ICD-10-CM | POA: Diagnosis not present

## 2022-05-22 DIAGNOSIS — N952 Postmenopausal atrophic vaginitis: Secondary | ICD-10-CM | POA: Diagnosis not present

## 2022-05-29 DIAGNOSIS — H524 Presbyopia: Secondary | ICD-10-CM | POA: Diagnosis not present

## 2022-05-29 DIAGNOSIS — H25813 Combined forms of age-related cataract, bilateral: Secondary | ICD-10-CM | POA: Diagnosis not present

## 2022-07-24 DIAGNOSIS — E7841 Elevated Lipoprotein(a): Secondary | ICD-10-CM | POA: Diagnosis not present

## 2022-07-27 DIAGNOSIS — R3915 Urgency of urination: Secondary | ICD-10-CM | POA: Diagnosis not present

## 2022-07-27 DIAGNOSIS — D649 Anemia, unspecified: Secondary | ICD-10-CM | POA: Diagnosis not present

## 2022-07-27 DIAGNOSIS — R309 Painful micturition, unspecified: Secondary | ICD-10-CM | POA: Diagnosis not present

## 2022-07-27 DIAGNOSIS — E7800 Pure hypercholesterolemia, unspecified: Secondary | ICD-10-CM | POA: Diagnosis not present

## 2022-07-27 DIAGNOSIS — I10 Essential (primary) hypertension: Secondary | ICD-10-CM | POA: Diagnosis not present

## 2022-07-27 DIAGNOSIS — R1030 Lower abdominal pain, unspecified: Secondary | ICD-10-CM | POA: Diagnosis not present

## 2022-07-27 DIAGNOSIS — K219 Gastro-esophageal reflux disease without esophagitis: Secondary | ICD-10-CM | POA: Diagnosis not present

## 2022-07-27 DIAGNOSIS — N390 Urinary tract infection, site not specified: Secondary | ICD-10-CM | POA: Diagnosis not present

## 2022-07-31 DIAGNOSIS — L720 Epidermal cyst: Secondary | ICD-10-CM | POA: Diagnosis not present

## 2022-07-31 DIAGNOSIS — D239 Other benign neoplasm of skin, unspecified: Secondary | ICD-10-CM | POA: Diagnosis not present

## 2022-08-12 DIAGNOSIS — M25531 Pain in right wrist: Secondary | ICD-10-CM | POA: Diagnosis not present

## 2022-08-12 DIAGNOSIS — M65831 Other synovitis and tenosynovitis, right forearm: Secondary | ICD-10-CM | POA: Diagnosis not present

## 2022-08-12 DIAGNOSIS — M65839 Other synovitis and tenosynovitis, unspecified forearm: Secondary | ICD-10-CM | POA: Diagnosis not present

## 2022-08-13 DIAGNOSIS — R300 Dysuria: Secondary | ICD-10-CM | POA: Diagnosis not present

## 2022-08-13 DIAGNOSIS — R109 Unspecified abdominal pain: Secondary | ICD-10-CM | POA: Diagnosis not present

## 2022-08-16 DIAGNOSIS — I2510 Atherosclerotic heart disease of native coronary artery without angina pectoris: Secondary | ICD-10-CM | POA: Diagnosis not present

## 2022-08-16 DIAGNOSIS — R109 Unspecified abdominal pain: Secondary | ICD-10-CM | POA: Diagnosis not present

## 2022-08-16 DIAGNOSIS — D259 Leiomyoma of uterus, unspecified: Secondary | ICD-10-CM | POA: Diagnosis not present

## 2022-08-18 DIAGNOSIS — R102 Pelvic and perineal pain: Secondary | ICD-10-CM | POA: Diagnosis not present

## 2022-09-09 DIAGNOSIS — M25512 Pain in left shoulder: Secondary | ICD-10-CM | POA: Diagnosis not present

## 2022-09-09 DIAGNOSIS — M25561 Pain in right knee: Secondary | ICD-10-CM | POA: Diagnosis not present

## 2022-09-18 DIAGNOSIS — G4733 Obstructive sleep apnea (adult) (pediatric): Secondary | ICD-10-CM | POA: Diagnosis not present

## 2022-09-18 DIAGNOSIS — Z79899 Other long term (current) drug therapy: Secondary | ICD-10-CM | POA: Diagnosis not present

## 2022-09-18 DIAGNOSIS — R002 Palpitations: Secondary | ICD-10-CM | POA: Diagnosis not present

## 2022-09-18 DIAGNOSIS — E7800 Pure hypercholesterolemia, unspecified: Secondary | ICD-10-CM | POA: Diagnosis not present

## 2022-09-18 DIAGNOSIS — I10 Essential (primary) hypertension: Secondary | ICD-10-CM | POA: Diagnosis not present

## 2022-09-24 DIAGNOSIS — G4730 Sleep apnea, unspecified: Secondary | ICD-10-CM | POA: Diagnosis not present

## 2022-09-26 ENCOUNTER — Other Ambulatory Visit: Payer: Medicare PPO

## 2022-10-07 DIAGNOSIS — R8290 Unspecified abnormal findings in urine: Secondary | ICD-10-CM | POA: Diagnosis not present

## 2022-10-07 DIAGNOSIS — Z87440 Personal history of urinary (tract) infections: Secondary | ICD-10-CM | POA: Diagnosis not present

## 2022-10-08 ENCOUNTER — Other Ambulatory Visit: Payer: Self-pay | Admitting: Obstetrics and Gynecology

## 2022-10-08 DIAGNOSIS — Z0000 Encounter for general adult medical examination without abnormal findings: Secondary | ICD-10-CM

## 2022-10-15 DIAGNOSIS — G4733 Obstructive sleep apnea (adult) (pediatric): Secondary | ICD-10-CM | POA: Diagnosis not present

## 2022-10-22 DIAGNOSIS — R1032 Left lower quadrant pain: Secondary | ICD-10-CM | POA: Diagnosis not present

## 2022-10-22 DIAGNOSIS — J301 Allergic rhinitis due to pollen: Secondary | ICD-10-CM | POA: Diagnosis not present

## 2022-10-22 DIAGNOSIS — R030 Elevated blood-pressure reading, without diagnosis of hypertension: Secondary | ICD-10-CM | POA: Diagnosis not present

## 2022-10-22 DIAGNOSIS — K5900 Constipation, unspecified: Secondary | ICD-10-CM | POA: Diagnosis not present

## 2022-10-22 DIAGNOSIS — R140 Abdominal distension (gaseous): Secondary | ICD-10-CM | POA: Diagnosis not present

## 2022-10-22 DIAGNOSIS — R0683 Snoring: Secondary | ICD-10-CM | POA: Diagnosis not present

## 2022-10-22 DIAGNOSIS — G4733 Obstructive sleep apnea (adult) (pediatric): Secondary | ICD-10-CM | POA: Diagnosis not present

## 2022-10-22 DIAGNOSIS — G4730 Sleep apnea, unspecified: Secondary | ICD-10-CM | POA: Diagnosis not present

## 2022-11-28 DIAGNOSIS — I10 Essential (primary) hypertension: Secondary | ICD-10-CM | POA: Diagnosis not present

## 2022-11-28 DIAGNOSIS — E7800 Pure hypercholesterolemia, unspecified: Secondary | ICD-10-CM | POA: Diagnosis not present

## 2022-11-28 DIAGNOSIS — R233 Spontaneous ecchymoses: Secondary | ICD-10-CM | POA: Diagnosis not present

## 2022-12-17 DIAGNOSIS — R350 Frequency of micturition: Secondary | ICD-10-CM | POA: Diagnosis not present

## 2023-01-17 DIAGNOSIS — J019 Acute sinusitis, unspecified: Secondary | ICD-10-CM | POA: Diagnosis not present

## 2023-01-17 DIAGNOSIS — R059 Cough, unspecified: Secondary | ICD-10-CM | POA: Diagnosis not present

## 2023-01-17 DIAGNOSIS — R509 Fever, unspecified: Secondary | ICD-10-CM | POA: Diagnosis not present

## 2023-02-05 ENCOUNTER — Ambulatory Visit
Admission: RE | Admit: 2023-02-05 | Discharge: 2023-02-05 | Disposition: A | Discharge status: Home / Self Care | Payer: Medicare PPO | Source: Ambulatory Visit | Attending: Obstetrics and Gynecology | Admitting: Obstetrics and Gynecology

## 2023-02-05 DIAGNOSIS — E2839 Other primary ovarian failure: Secondary | ICD-10-CM

## 2023-02-23 ENCOUNTER — Ambulatory Visit: Payer: Medicare PPO

## 2023-02-24 ENCOUNTER — Ambulatory Visit
Admission: RE | Admit: 2023-02-24 | Discharge: 2023-02-24 | Disposition: A | Discharge status: Home / Self Care | Payer: Medicare PPO | Source: Ambulatory Visit | Attending: Obstetrics and Gynecology | Admitting: Obstetrics and Gynecology

## 2023-02-24 DIAGNOSIS — Z0000 Encounter for general adult medical examination without abnormal findings: Secondary | ICD-10-CM

## 2023-03-18 ENCOUNTER — Ambulatory Visit: Payer: Medicare PPO

## 2023-03-23 ENCOUNTER — Ambulatory Visit: Payer: Medicare PPO

## 2023-07-02 ENCOUNTER — Other Ambulatory Visit: Payer: Self-pay | Admitting: Internal Medicine

## 2023-07-02 DIAGNOSIS — E041 Nontoxic single thyroid nodule: Secondary | ICD-10-CM

## 2023-07-07 ENCOUNTER — Other Ambulatory Visit: Payer: Medicare PPO

## 2023-07-16 ENCOUNTER — Other Ambulatory Visit: Payer: Medicare PPO

## 2023-07-23 ENCOUNTER — Ambulatory Visit
Admission: RE | Admit: 2023-07-23 | Discharge: 2023-07-23 | Disposition: A | Discharge status: Home / Self Care | Payer: Medicare PPO | Source: Ambulatory Visit | Attending: Internal Medicine | Admitting: Internal Medicine

## 2023-07-23 DIAGNOSIS — E041 Nontoxic single thyroid nodule: Secondary | ICD-10-CM

## 2023-10-20 ENCOUNTER — Emergency Department (HOSPITAL_COMMUNITY)

## 2023-10-20 ENCOUNTER — Other Ambulatory Visit: Payer: Self-pay

## 2023-10-20 ENCOUNTER — Encounter (HOSPITAL_COMMUNITY): Payer: Self-pay

## 2023-10-20 ENCOUNTER — Emergency Department (HOSPITAL_COMMUNITY): Admission: EM | Admit: 2023-10-20 | Discharge: 2023-10-20 | Disposition: A | Discharge status: Home / Self Care

## 2023-10-20 DIAGNOSIS — I10 Essential (primary) hypertension: Secondary | ICD-10-CM | POA: Diagnosis not present

## 2023-10-20 DIAGNOSIS — Z7982 Long term (current) use of aspirin: Secondary | ICD-10-CM | POA: Insufficient documentation

## 2023-10-20 DIAGNOSIS — R079 Chest pain, unspecified: Secondary | ICD-10-CM | POA: Insufficient documentation

## 2023-10-20 DIAGNOSIS — K209 Esophagitis, unspecified without bleeding: Secondary | ICD-10-CM | POA: Insufficient documentation

## 2023-10-20 DIAGNOSIS — Z79899 Other long term (current) drug therapy: Secondary | ICD-10-CM | POA: Diagnosis not present

## 2023-10-20 DIAGNOSIS — R110 Nausea: Secondary | ICD-10-CM | POA: Diagnosis present

## 2023-10-20 LAB — BASIC METABOLIC PANEL WITH GFR
Anion gap: 10 (ref 5–15)
BUN: 14 mg/dL (ref 8–23)
CO2: 27 mmol/L (ref 22–32)
Calcium: 9.7 mg/dL (ref 8.9–10.3)
Chloride: 103 mmol/L (ref 98–111)
Creatinine, Ser: 0.9 mg/dL (ref 0.44–1.00)
GFR, Estimated: >60 mL/min (ref >60)
Glucose, Bld: 86 mg/dL (ref 70–99)
Potassium: 4.1 mmol/L (ref 3.5–5.1)
Sodium: 140 mmol/L (ref 135–145)

## 2023-10-20 LAB — CBC
HCT: 41.1 % (ref 36.0–46.0)
Hemoglobin: 13.1 g/dL (ref 12.0–15.0)
MCH: 31 pg (ref 26.0–34.0)
MCHC: 31.9 g/dL (ref 30.0–36.0)
MCV: 97.4 fL (ref 80.0–100.0)
Platelets: 220 10*3/uL (ref 150–400)
RBC: 4.22 MIL/uL (ref 3.87–5.11)
RDW: 15.1 % (ref 11.5–15.5)
WBC: 3.4 10*3/uL — ABNORMAL LOW (ref 4.0–10.5)
nRBC: 0 % (ref 0.0–0.2)

## 2023-10-20 LAB — TROPONIN I (HIGH SENSITIVITY)
Troponin I (High Sensitivity): 13 ng/L (ref <18)
Troponin I (High Sensitivity): 13 ng/L (ref <18)

## 2023-10-20 MED ORDER — SUCRALFATE 1 G PO TABS: 1.0000 g | ORAL_TABLET | Freq: Three times a day (TID) | ORAL | 0 refills | Status: AC

## 2023-10-20 MED ORDER — ONDANSETRON HCL 4 MG/2ML IJ SOLN
4.0000 mg | Freq: Once | INTRAMUSCULAR | Status: AC
  Administered 2023-10-20: 4 mg via INTRAVENOUS
  Filled 2023-10-20: qty 2

## 2023-10-20 MED ORDER — MORPHINE SULFATE (PF) 4 MG/ML IV SOLN
4.0000 mg | Freq: Once | INTRAVENOUS | Status: AC
  Administered 2023-10-20: 4 mg via INTRAVENOUS
  Filled 2023-10-20: qty 1

## 2023-10-20 MED ORDER — IOHEXOL 350 MG/ML SOLN
75.0000 mL | Freq: Once | INTRAVENOUS | Status: AC | PRN
  Administered 2023-10-20: 75 mL via INTRAVENOUS

## 2023-10-20 NOTE — ED Triage Notes (Signed)
 Pt to er, pt states that she has pain from her shoulder blade going into her chest and into her L arm, states that he fingers feel cold, states that it started about 45 minutes ago when she sat down to eat breakfast, denies similar episodes

## 2023-10-20 NOTE — Discharge Instructions (Signed)
 Please continue the esomeprazole.  Take the carafate on top of this and call to schedule a follow up appt with your gastroenterologist.  I have made a referral to cardiology as well.  You should receive a call in the next few days regarding follow-up.  Please return to the emergency department for worsening symptoms.

## 2023-10-20 NOTE — ED Provider Notes (Signed)
 [place] EMERGENCY DEPARTMENT AT [facility] Provider Note   CSN: [id number] Arrival date & time: 10/20/23  1101     History  Chief Complaint  Patient presents with   Chest Pain    Katelyn Hudson is a 68 y.o. female.  68 year old female with past medical history of hypertension and hyperlipidemia presenting to the emergency department today with left-sided chest and back pain.  The patient states that after eating this morning she developed pain in the left side of her back.  She states that this radiated through to her left anterior chest.  She states that this has been persistent since then.  She states that it is a dull pain but is relatively severe.  She has had some nausea but denies any associated vomiting.  Denies any abdominal pain.  Denies any focal weakness, numbness, or tingling.  She states that she checked her blood pressure a few times and it was more elevated than normal despite taking her medications.  She came to the ER today for further evaluation regarding this.   Chest Pain Associated symptoms: nausea        Home Medications Prior to Admission medications   Medication Sig Start Date End Date Taking? Authorizing Provider  sucralfate (CARAFATE) 1 g tablet Take 1 tablet (1 g total) by mouth 4 (four) times daily -  with meals and at bedtime. 10/20/23  Yes Carin Charleston, MD  albuterol (PROVENTIL HFA;VENTOLIN HFA) 108 (90 BASE) MCG/ACT inhaler Inhale 2-3 puffs into the lungs every 6 (six) hours as needed for wheezing or shortness of breath.    [provider]  aspirin 81 MG chewable tablet Chew 81 mg by mouth 3 (three) times a week. In the evening    [provider]  cetirizine (ZYRTEC) 10 MG tablet Take 5-10 mg by mouth at bedtime as needed for allergies.    [provider]  Cyanocobalamin (VITAMIN B-12 PO) Take 1 tablet by mouth 3 (three) times a week.    [provider]  esomeprazole (NEXIUM 24HR) 20 MG capsule Take  20 mg by mouth every evening.    [provider]  losartan (COZAAR) 100 MG tablet Take 100 mg by mouth daily.    [provider]  MAGNESIUM PO Take 1 tablet by mouth 2 (two) times daily.    [provider]  Omega-3 Fatty Acids (FISH OIL PO) Take 15 mLs by mouth daily.    [provider]  polyvinyl alcohol (LIQUIFILM TEARS) 1.4 % ophthalmic solution Place 1 drop into both eyes 3 (three) times daily as needed for dry eyes.    [provider]  Probiotic Product (PROBIOTIC PO) Take 1 capsule by mouth daily.    [provider]  valsartan (DIOVAN) 160 MG tablet Take 160 mg by mouth daily. 10/10/16   [provider]      Allergies    Sulfa antibiotics, Azithromycin, and Statins    Review of Systems   Review of Systems  Cardiovascular:  Positive for chest pain.  Gastrointestinal:  Positive for nausea.  All other systems reviewed and are negative.   Physical Exam Updated Vital Signs BP (!) 158/77   Pulse (!) 44   Temp 98.4 F (36.9 C) (Oral)   Resp 16   Ht 4\' 11"  (1.499 m)   Wt 59 kg   SpO2 96%   BMI 26.26 kg/m  Physical Exam Vitals and nursing note reviewed.   Gen: NAD Eyes: PERRL, EOMI HEENT:  no oropharyngeal swelling Neck: trachea midline Resp: clear to auscultation bilaterally Card: RRR, no murmurs, rubs, or gallops Abd: nontender, nondistended Extremities: no calf tenderness, no edema Vascular: 2+ radial pulses bilaterally, 2+ DP pulses bilaterally Neuro: no focal deficits Skin: no rashes Psyc: acting appropriately   ED Results / Procedures / Treatments   Labs (all labs ordered are listed, but only abnormal results are displayed) Labs Reviewed  CBC - Abnormal; Notable for the following components:      Result Value   WBC 3.4 (*)    All other components within normal limits  BASIC METABOLIC PANEL WITH GFR  TROPONIN I (HIGH SENSITIVITY)  TROPONIN I (HIGH SENSITIVITY)    EKG EKG  Interpretation Date/Time:  Tuesday Oct 20 2023 11:17:47 EDT Ventricular Rate:  59 PR Interval:  144 QRS Duration:  78 QT Interval:  434 QTC Calculation: 429 R Axis:   54  Text Interpretation: Sinus bradycardia Nonspecific ST and T wave abnormality Abnormal ECG No previous ECGs available Confirmed by Abner Hoffman [phone]) on 10/20/2023 2:39:04 PM  Radiology CT Angio Chest Aorta W and/or Wo Contrast Result Date: 10/20/2023 CLINICAL DATA:  Acute aortic syndrome (AAS) suspected Chest pain. EXAM: CT ANGIOGRAPHY CHEST WITH CONTRAST TECHNIQUE: Multidetector CT imaging of the chest was performed using the standard protocol during bolus administration of intravenous contrast. Multiplanar CT image reconstructions and MIPs were obtained to evaluate the vascular anatomy. RADIATION DOSE REDUCTION: This exam was performed according to the departmental dose-optimization program which includes automated exposure control, adjustment of the mA and/or kV according to patient size and/or use of iterative reconstruction technique. CONTRAST:  75mL OMNIPAQUE  IOHEXOL  350 MG/ML SOLN COMPARISON:  Radiograph earlier today FINDINGS: Cardiovascular: The thoracic aorta is normal in caliber. No dissection or acute aortic findings. No significant atherosclerosis. No aneurysm. The heart is normal in size. No pericardial effusion. There is no central pulmonary embolus to the lobar level. Mediastinum/Nodes: No enlarged mediastinal, hilar, or axillary lymph nodes. Unremarkable thyroid  gland. Patulous esophagus with mild wall thickening at the gastroesophageal junction. Lungs/Pleura: Subsegmental atelectasis in the right lower lobe. No confluent airspace disease. No pleural effusion. No features of pulmonary edema. Upper Abdomen: No acute abnormality. Musculoskeletal: There are no acute or suspicious osseous abnormalities. Thoracic spondylosis with anterior spurring. Review of the MIP images confirms the above findings. IMPRESSION: 1. No  aortic dissection or acute aortic findings. 2. Patulous esophagus with mild wall thickening at the gastroesophageal junction. Recommend correlation for reflux or esophagitis. 3. Subsegmental atelectasis in the right lower lobe. Electronically Signed   By: Chadwick Colonel M.D.   On: 10/20/2023 15:56   DG Chest 2 View Result Date: 10/20/2023 CLINICAL DATA:  Chest pain. EXAM: CHEST - 2 VIEW COMPARISON:  None Available. FINDINGS: The cardiomediastinal contours are normal. The lungs are clear. Pulmonary vasculature is normal. No consolidation, pleural effusion, or pneumothorax. Mild midthoracic spurring. No acute osseous abnormalities are seen. IMPRESSION: No active cardiopulmonary disease. Electronically Signed   By: Chadwick Colonel M.D.   On: 10/20/2023 15:49    Procedures Procedures    Medications Ordered in ED Medications  morphine  (PF) 4 MG/ML injection 4 mg (4 mg Intravenous Given 10/20/23 1509)  ondansetron  (ZOFRAN ) injection 4 mg (4 mg Intravenous Given 10/20/23 1509)  iohexol  (OMNIPAQUE ) 350 MG/ML injection 75 mL (75 mLs Intravenous Contrast Given 10/20/23 1526)    ED Course/ Medical Decision Making/ A&P  Medical Decision Making 68 year old female with past medical history of hypertension and hyperlipidemia presenting to the emergency department today with chest and back pain.  The patient's initial workup in triage is unremarkable with a negative first troponin.  EKG does not show any obvious signs of ischemia.  Given the description of her symptoms we will obtain a CT angiogram to eval for aortic pathology here given the description of her symptoms and elevated blood pressure..  Will give her morphine  and Zofran  for her symptoms.  I will reevaluate for ultimate disposition.  The patient's second troponin is negative.  Her heart rate is in the high 40s and low 50s but this is a sinus tachycardia.  Her CT scan did show esophagitis but no aortic pathology.   She is on Nexium.  Will also give her Carafate.  She does have a gastroenterologist and is encouraged to follow-up regarding this.  She is discharged with return precautions.  Amount and/or Complexity of Data Reviewed Labs: ordered. Radiology: ordered.  Risk Prescription drug management.           Final Clinical Impression(s) / ED Diagnoses Final diagnoses:  Esophagitis  Chest pain, unspecified type    Rx / DC Orders ED Discharge Orders          Ordered    sucralfate (CARAFATE) 1 g tablet  3 times daily with meals & bedtime        10/20/23 1615    Ambulatory referral to Cardiology       Comments: If you have not heard from the Cardiology office within the next 72 hours please call [phone].   10/20/23 1615              Carin Charleston, MD 10/20/23 [phone]

## 2023-10-20 NOTE — ED Notes (Signed)
AVS with prescriptions provided to and discussed with patient and family member at bedside. Pt verbalizes understanding of discharge instructions and denies any questions or concerns at this time. Pt has ride home. Pt ambulated out of department independently with steady gait.  

## 2024-01-27 ENCOUNTER — Other Ambulatory Visit: Payer: Self-pay | Admitting: Obstetrics and Gynecology

## 2024-01-27 DIAGNOSIS — Z1231 Encounter for screening mammogram for malignant neoplasm of breast: Secondary | ICD-10-CM

## 2024-02-24 ENCOUNTER — Other Ambulatory Visit: Payer: Self-pay | Admitting: Medical Genetics

## 2024-02-26 ENCOUNTER — Ambulatory Visit

## 2024-02-26 ENCOUNTER — Ambulatory Visit
Admission: RE | Admit: 2024-02-26 | Discharge: 2024-02-26 | Disposition: A | Discharge status: Home / Self Care | Source: Ambulatory Visit | Attending: Obstetrics and Gynecology | Admitting: Obstetrics and Gynecology

## 2024-02-26 DIAGNOSIS — Z1231 Encounter for screening mammogram for malignant neoplasm of breast: Secondary | ICD-10-CM

## 2024-02-29 ENCOUNTER — Encounter: Payer: Self-pay | Admitting: Internal Medicine

## 2024-02-29 ENCOUNTER — Ambulatory Visit: Attending: Internal Medicine | Admitting: Internal Medicine

## 2024-02-29 VITALS — BP 134/80 | HR 72 | Ht 59.0 in | Wt 134.2 lb

## 2024-02-29 DIAGNOSIS — R079 Chest pain, unspecified: Secondary | ICD-10-CM | POA: Diagnosis not present

## 2024-02-29 DIAGNOSIS — E7849 Other hyperlipidemia: Secondary | ICD-10-CM

## 2024-02-29 DIAGNOSIS — I10 Essential (primary) hypertension: Secondary | ICD-10-CM | POA: Diagnosis not present

## 2024-02-29 NOTE — Progress Notes (Unsigned)
 Cardiology Office Note  Date: 02/29/2024   ID: Katelyn Hudson, DOB 1956-03-15, MRN [id number]  PCP:  [facility]  Cardiologist:  None Electrophysiologist:  None   History of Present Illness: Katelyn Hudson is a 68 y.o. female  Past Medical History:  Diagnosis Date   Hypertension     Past Surgical History:  Procedure Laterality Date   FOOT SURGERY     UPPER ESOPHAGEAL ENDOSCOPIC ULTRASOUND (EUS) N/A 11/07/2016   Procedure: UPPER ESOPHAGEAL ENDOSCOPIC ULTRASOUND (EUS);  Surgeon: Rollin Dover, MD;  Location: [facility];  Service: Endoscopy;  Laterality: N/A;    Current Outpatient Medications  Medication Sig Dispense Refill   albuterol (PROVENTIL HFA;VENTOLIN HFA) 108 (90 BASE) MCG/ACT inhaler Inhale 2-3 puffs into the lungs every 6 (six) hours as needed for wheezing or shortness of breath.     cetirizine (ZYRTEC) 10 MG tablet Take 5-10 mg by mouth at bedtime as needed for allergies.     Cholecalciferol (D3 VITAMIN PO) Take by mouth daily.     Cyanocobalamin (VITAMIN B-12 PO) Take 1 tablet by mouth 3 (three) times a week.     losartan (COZAAR) 100 MG tablet Take 100 mg by mouth daily.     MAGNESIUM PO Take 1 tablet by mouth 2 (two) times daily.     Omega-3 Fatty Acids (FISH OIL PO) Take 15 mLs by mouth daily.     omeprazole (PRILOSEC) 10 MG capsule Take 10 mg by mouth daily.     Probiotic Product (PROBIOTIC PO) Take 1 capsule by mouth daily.     polyvinyl alcohol (LIQUIFILM TEARS) 1.4 % ophthalmic solution Place 1 drop into both eyes 3 (three) times daily as needed for dry eyes. (Patient not taking: Reported on 02/29/2024)     sucralfate  (CARAFATE ) 1 g tablet Take 1 tablet (1 g total) by mouth 4 (four) times daily -  with meals and at bedtime. (Patient not taking: Reported on 02/29/2024) 60 tablet 0   No current facility-administered medications for this visit.   Allergies:  Sulfa antibiotics, Azithromycin, and Statins   Social History: The patient   reports that she has never smoked. She has never used smokeless tobacco. She reports that she does not drink alcohol and does not use drugs.   Family History: [name] history includes Breast cancer (age of onset: 24) in her maternal aunt.   ROS:  Please see the history of present illness. Otherwise, complete review of systems is positive for none.  All other systems are reviewed and negative.   Physical Exam: VS:  BP 134/80 (BP Location: Left Arm, Cuff Size: Normal)   Pulse 72   Ht 4' 11 (1.499 m)   Wt 134 lb 3.2 oz (60.9 kg)   SpO2 97%   BMI 27.11 kg/m , BMI Body mass index is 27.11 kg/m.  Wt Readings from Last 3 Encounters:  02/29/24 134 lb 3.2 oz (60.9 kg)  10/20/23 130 lb (59 kg)  11/07/16 130 lb (59 kg)    General: Patient appears comfortable at rest. HEENT: Conjunctiva and lids normal, oropharynx clear with moist mucosa. Neck: Supple, no elevated JVP or carotid bruits, no thyromegaly. Lungs: Clear to auscultation, nonlabored breathing at rest. Cardiac: Regular rate and rhythm, no S3 or significant systolic murmur, no pericardial rub. Abdomen: Soft, nontender, no hepatomegaly, bowel sounds present, no guarding or rebound. Extremities: No pitting edema, distal pulses 2+. Skin: Warm and dry. Musculoskeletal: No kyphosis. Neuropsychiatric: Alert and oriented x3, affect grossly appropriate.  Recent Labwork: 10/20/2023: BUN 14; Creatinine, Ser 0.90; Hemoglobin 13.1; Platelets 220; Potassium 4.1; Sodium 140  No results found for: CHOL, TRIG, HDL, CHOLHDL, VLDL, LDLCALC, LDLDIRECT   Assessment and Plan:      Medication Adjustments/Labs and Tests Ordered: Current medicines are reviewed at length with the patient today.  Concerns regarding medicines are outlined above.    Disposition:  Follow up   Signed, Leahann Lempke Arleta Maywood, MD, 02/29/2024 3:40 PM    [facility] at [facility] [address] [place], [place]  [address]

## 2024-02-29 NOTE — Patient Instructions (Signed)
Medication Instructions:  Your physician recommends that you continue on your current medications as directed. Please refer to the Current Medication list given to you today.  Labwork: None today  Testing/Procedures: None today  Follow-Up: 4-6 weeks  Any Other Special Instructions Will Be Listed Below (If Applicable).  If you need a refill on your cardiac medications before your next appointment, please call your pharmacy.

## 2024-03-01 DIAGNOSIS — I10 Essential (primary) hypertension: Secondary | ICD-10-CM | POA: Insufficient documentation

## 2024-03-01 DIAGNOSIS — E785 Hyperlipidemia, unspecified: Secondary | ICD-10-CM | POA: Insufficient documentation

## 2024-03-04 ENCOUNTER — Ambulatory Visit: Payer: Self-pay | Admitting: Internal Medicine

## 2024-03-08 LAB — LIPID PANEL
Chol/HDL Ratio: 2.3 ratio (ref 0.0–4.4)
Cholesterol, Total: 197 mg/dL (ref 100–199)
HDL: 84 mg/dL (ref >39)
LDL Chol Calc (NIH): 103 mg/dL — ABNORMAL HIGH (ref 0–99)
Triglycerides: 51 mg/dL (ref 0–149)
VLDL Cholesterol Cal: 10 mg/dL (ref 5–40)

## 2024-03-11 ENCOUNTER — Other Ambulatory Visit (HOSPITAL_COMMUNITY)
Admission: RE | Admit: 2024-03-11 | Discharge: 2024-03-11 | Disposition: A | Discharge status: Home / Self Care | Payer: Self-pay | Source: Ambulatory Visit | Attending: Medical Genetics | Admitting: Medical Genetics

## 2024-03-18 ENCOUNTER — Ambulatory Visit: Payer: Self-pay | Admitting: Internal Medicine

## 2024-03-21 LAB — GENECONNECT MOLECULAR SCREEN: Genetic Analysis Overall Interpretation: NEGATIVE

## 2024-05-06 ENCOUNTER — Other Ambulatory Visit (HOSPITAL_COMMUNITY)
Admission: RE | Admit: 2024-05-06 | Discharge: 2024-05-06 | Disposition: A | Source: Ambulatory Visit | Attending: Internal Medicine | Admitting: Internal Medicine

## 2024-05-06 ENCOUNTER — Encounter: Payer: Self-pay | Admitting: Internal Medicine

## 2024-05-06 ENCOUNTER — Ambulatory Visit: Attending: Internal Medicine | Admitting: Internal Medicine

## 2024-05-06 VITALS — BP 122/76 | HR 62 | Ht 59.75 in | Wt 135.0 lb

## 2024-05-06 DIAGNOSIS — E785 Hyperlipidemia, unspecified: Secondary | ICD-10-CM | POA: Diagnosis not present

## 2024-05-06 NOTE — Progress Notes (Signed)
 Cardiology Office Note  Date: 05/06/2024   ID: Katelyn Hudson, DOB Jan 06, 1956, MRN [id number]  PCP:  [facility]  Cardiologist:  Katelyn Hafen P Nakeyia Menden, MD Electrophysiologist:  None   History of Present Illness: Analy Hudson is a 69 y.o. female  Patient was initially referred to cardiology clinic for evaluation of chest pain.  After GI medications were started, her chest pain is completely resolved.  She was previously followed by outside cardiologist.  I reviewed the records.  CT cardiac coronary calcium score was 0.5.  Her LDL initially was 123 and dropped to 103 mainly with diet and exercise interventions.  Currently not on statin.  Did not tolerate statins in the past.  Does not have any symptoms of angina, DOE, dizziness, syncope, palpitations, leg swelling.  She reported that she has LPA levels checked at her outside cardiologist office and those were elevated.  I do not have the report to review.  Past Medical History:  Diagnosis Date   Hypertension     Past Surgical History:  Procedure Laterality Date   FOOT SURGERY     UPPER ESOPHAGEAL ENDOSCOPIC ULTRASOUND (EUS) N/A 11/07/2016   Procedure: UPPER ESOPHAGEAL ENDOSCOPIC ULTRASOUND (EUS);  Surgeon: Rollin Dover, MD;  Location: [facility];  Service: Endoscopy;  Laterality: N/A;    Current Outpatient Medications  Medication Sig Dispense Refill   albuterol (PROVENTIL HFA;VENTOLIN HFA) 108 (90 BASE) MCG/ACT inhaler Inhale 2-3 puffs into the lungs every 6 (six) hours as needed for wheezing or shortness of breath.     cetirizine (ZYRTEC) 10 MG tablet Take 5-10 mg by mouth at bedtime as needed for allergies.     Cholecalciferol (D3 VITAMIN PO) Take by mouth daily.     Cyanocobalamin (VITAMIN B-12 PO) Take 1 tablet by mouth 3 (three) times a week.     losartan (COZAAR) 100 MG tablet Take 100 mg by mouth daily.     MAGNESIUM PO Take 1 tablet by mouth 2 (two) times daily.     Omega-3 Fatty Acids (FISH  OIL PO) Take 15 mLs by mouth daily.     omeprazole (PRILOSEC) 10 MG capsule Take 10 mg by mouth daily.     polyvinyl alcohol (LIQUIFILM TEARS) 1.4 % ophthalmic solution Place 1 drop into both eyes 3 (three) times daily as needed for dry eyes.     Probiotic Product (PROBIOTIC PO) Take 1 capsule by mouth daily.     sucralfate  (CARAFATE ) 1 g tablet Take 1 tablet (1 g total) by mouth 4 (four) times daily -  with meals and at bedtime. 60 tablet 0   No current facility-administered medications for this visit.   Allergies:  Sulfa antibiotics, Azithromycin, and Statins   Social History: The patient  reports that she has never smoked. She has never used smokeless tobacco. She reports that she does not drink alcohol and does not use drugs.   Family History: [name] history includes Breast cancer (age of onset: 2) in her maternal aunt.   ROS:  Please see the history of present illness. Otherwise, complete review of systems is positive for none.  All other systems are reviewed and negative.   Physical Exam: VS:  BP 122/76   Pulse 62   Ht 4' 11.75 (1.518 m)   Wt 135 lb (61.2 kg)   SpO2 98%   BMI 26.59 kg/m , BMI Body mass index is 26.59 kg/m.  Wt Readings from Last 3 Encounters:  05/06/24 135 lb (61.2 kg)  02/29/24  134 lb 3.2 oz (60.9 kg)  10/20/23 130 lb (59 kg)    General: Patient appears comfortable at rest. HEENT: Conjunctiva and lids normal, oropharynx clear with moist mucosa. Neck: Supple, no elevated JVP or carotid bruits, no thyromegaly. Lungs: Clear to auscultation, nonlabored breathing at rest. Cardiac: Regular rate and rhythm, no S3 or significant systolic murmur, no pericardial rub. Abdomen: Soft, nontender, no hepatomegaly, bowel sounds present, no guarding or rebound. Extremities: No pitting edema, distal pulses 2+. Skin: Warm and dry. Musculoskeletal: No kyphosis. Neuropsychiatric: Alert and oriented x3, affect grossly appropriate.  Recent  Labwork: 10/20/2023: BUN 14; Creatinine, Ser 0.90; Hemoglobin 13.1; Platelets 220; Potassium 4.1; Sodium 140     Component Value Date/Time   CHOL 197 03/07/2024 1332   TRIG 51 03/07/2024 1332   HDL 84 03/07/2024 1332   CHOLHDL 2.3 03/07/2024 1332   LDLCALC 103 (H) 03/07/2024 1332     Assessment and Plan:  HLD, at goal - Coronary calcium score 0.5.  Previously LDL was 128 and currently it is 103 from 02/2024.  She was told her LPA levels were elevated by her outside cardiologist.  Will obtain LPA levels.  If her LPA levels are elevated, she will need to be started on PCSK9 inhibitors.  Intolerant to statins.  She is agreeable to the plan.  HTN, controlled - Continue current antihypertensives, losartan 100 mg once daily.  30 minutes spent in review the prior records, more than 3 labs, discussion of the above plan with the patient and documentation.  Medication Adjustments/Labs and Tests Ordered: Current medicines are reviewed at length with the patient today.  Concerns regarding medicines are outlined above.    Disposition:  Follow up pending results  Signed, Katelyn Trainer Arleta Maywood, MD, 05/06/2024 10:16 AM    [facility] at [facility] [address] [place], [place] [address]

## 2024-05-06 NOTE — Patient Instructions (Signed)
 Medication Instructions:  Your physician recommends that you continue on your current medications as directed. Please refer to the Current Medication list given to you today.  *If you need a refill on your cardiac medications before your next appointment, please call your pharmacy*  Lab Work: LP(a)  If you have labs (blood work) drawn today and your tests are completely normal, you will receive your results only by: MyChart Message (if you have MyChart) OR A paper copy in the mail If you have any lab test that is abnormal or we need to change your treatment, we will call you to review the results.  Testing/Procedures: None  Follow-Up: At [facility], you and your health needs are our priority.  As part of our continuing mission to provide you with exceptional heart care, our providers are all part of one team.  This team includes your primary Cardiologist (physician) and Advanced Practice Providers or APPs (Physician Assistants and Nurse Practitioners) who all work together to provide you with the care you need, when you need it.  Your next appointment:    To Be Determined  Provider:   You may see Vishnu P Mallipeddi, MD or one of the following Advanced Practice Providers on your designated Care Team:   Brittany Strader, PA-C  Scotesia Lockhart, [place] Olivia Pavy, [place]     We recommend signing up for the patient portal called MyChart.  Sign up information is provided on this After Visit Summary.  MyChart is used to connect with patients for Virtual Visits (Telemedicine).  Patients are able to view lab/test results, encounter notes, upcoming appointments, etc.  Non-urgent messages can be sent to your provider as well.   To learn more about what you can do with MyChart, go to [email or web address].   Other Instructions

## 2024-05-09 LAB — LIPOPROTEIN A (LPA): Lipoprotein (a): 330 nmol/L — ABNORMAL HIGH (ref <75.0)

## 2024-05-11 ENCOUNTER — Ambulatory Visit: Payer: Self-pay | Admitting: Internal Medicine

## 2024-05-12 MED ORDER — REPATHA SURECLICK 140 MG/ML ~~LOC~~ SOAJ: 140.0000 mg | SUBCUTANEOUS | 2 refills | Status: DC

## 2024-05-12 NOTE — Telephone Encounter (Signed)
 The patient has been notified of the result and verbalized understanding.  All questions (if any) were answered. Rosina JAYSON Cornea, CMA 05/12/2024 8:59 AM

## 2024-05-12 NOTE — Telephone Encounter (Signed)
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 Start Repatha . Needs screening of immediate family members with LP(a).  Schedule follow-up in 6 months.

## 2024-05-13 ENCOUNTER — Telehealth: Payer: Self-pay | Admitting: Pharmacy Technician

## 2024-05-13 ENCOUNTER — Other Ambulatory Visit (HOSPITAL_COMMUNITY): Payer: Self-pay

## 2024-05-13 NOTE — Telephone Encounter (Signed)
 Pharmacy Patient Advocate Encounter  Received notification from [employer] that Prior Authorization for repatha  has been APPROVED from 05/13/24 to 05/25/25   PA #/Case ID/Reference #: [id number]

## 2024-05-13 NOTE — Telephone Encounter (Signed)
" ° °  Pharmacy Patient Advocate Encounter   Received notification from Onbase that prior authorization for repatha  is required/requested.   Insurance verification completed.   The patient is insured through [employer].   Per test claim: PA required; PA submitted to above mentioned insurance via Latent Key/confirmation #/EOC [facility] Status is pending  "

## 2024-06-02 ENCOUNTER — Telehealth: Payer: Self-pay | Admitting: Internal Medicine

## 2024-06-02 NOTE — Telephone Encounter (Signed)
 Patient will come to [facility] office on Tuesday 1/13 to injection education with CMA (only day available for pt) at 4:15 (after clinic).   Patient had no questions or concerns at this time.

## 2024-06-02 NOTE — Telephone Encounter (Signed)
 Pt c/o medication issue:  1. Name of Medication:   Evolocumab  (REPATHA  SURECLICK) 140 MG/ML SOAJ    2. How are you currently taking this medication (dosage and times per day)?  Inject 140 mg into the skin every 14 (fourteen) days.       3. Are you having a reaction (difficulty breathing--STAT)? No  4. What is your medication issue? Pt stated she went to pick this medication up and was advised to contact the office so that she can see a pharmacist to show her how to take the medication. Please advise.

## 2024-06-07 ENCOUNTER — Ambulatory Visit: Attending: Cardiology

## 2024-06-07 DIAGNOSIS — E785 Hyperlipidemia, unspecified: Secondary | ICD-10-CM

## 2024-06-07 NOTE — Progress Notes (Signed)
 Patient presented today for nurse visit- Repatha  Education  Patient brought in medication (Repatha ) for education on use/injection.   Devices should be stored in the refrigerator. Before use, allow the device to reach room temperature naturally around 30 minutes.   Always check the expiration date on the device. Examine the medication through the viewing window to ensure it is clear and colorless to slightly yellow. Do not use the medication if it appears cloudy, discolored, or contains particles.  Appropriate injection sites include the abdomen (maintaining a distance from the belly button), the thigh, or the outer upper arm (if someone else is performing the injection). It is important to rotate injection sites for each dose to prevent skin irritation or other issues. Avoid injecting into areas that are bruised, red, hard, or scarred.  Thoroughly wash your hands with soap and water before preparing the injection. Clean the selected injection site with an alcohol wipe and allow the skin to air dry completely.  Immediately after the injection, the used device and cap should be placed in an FDA-cleared sharps disposal container.  Device-Specific Instructions.  Patient injected medication under supervision of CMA.   Patient stated she felt comfortable/confident in being able to inject herself with medication from this point forward. Patient had no further questions or concerns at this time.

## 2024-06-13 ENCOUNTER — Other Ambulatory Visit: Payer: Self-pay

## 2024-06-13 NOTE — Telephone Encounter (Signed)
 Resent to mail order pharmacy.

## 2024-06-17 MED ORDER — REPATHA SURECLICK 140 MG/ML ~~LOC~~ SOAJ: 140.0000 mg | SUBCUTANEOUS | 2 refills | Status: AC

## 2024-06-23 ENCOUNTER — Telehealth: Payer: Self-pay | Admitting: Pharmacy Technician

## 2024-06-23 ENCOUNTER — Other Ambulatory Visit (HOSPITAL_COMMUNITY): Payer: Self-pay

## 2024-06-23 NOTE — Telephone Encounter (Signed)
 Cancelled per plan:

## 2024-06-23 NOTE — Telephone Encounter (Signed)
 Pharmacy Patient Advocate Encounter   Received notification from CoverMyMeds that prior authorization for Repatha  is required/requested.   Insurance verification completed.   The patient is insured through [employer].   Per test claim: PA required; PA submitted to above mentioned insurance via Latent Key/confirmation #/EOC [id number] Status is pending
